# Patient Record
Sex: Male | Born: 1950 | Race: White | Hispanic: No | Marital: Married
Health system: Southern US, Community
[De-identification: ages and names within clinical notes are randomized; demographics above are authoritative.]

---

## 1999-11-17 ENCOUNTER — Encounter: Payer: Self-pay | Admitting: Family Medicine

## 1999-11-17 ENCOUNTER — Encounter: Admission: RE | Admit: 1999-11-17 | Discharge: 1999-11-17 | Payer: Self-pay | Admitting: Family Medicine

## 2002-12-22 ENCOUNTER — Emergency Department (HOSPITAL_COMMUNITY): Admission: EM | Admit: 2002-12-22 | Discharge: 2002-12-22 | Payer: Self-pay

## 2007-02-11 ENCOUNTER — Emergency Department (HOSPITAL_COMMUNITY): Admission: EM | Admit: 2007-02-11 | Discharge: 2007-02-11 | Payer: Self-pay | Admitting: Emergency Medicine

## 2007-02-14 ENCOUNTER — Ambulatory Visit (HOSPITAL_COMMUNITY): Admission: AD | Admit: 2007-02-14 | Discharge: 2007-02-14 | Payer: Self-pay | Admitting: Urology

## 2007-02-24 ENCOUNTER — Ambulatory Visit (HOSPITAL_BASED_OUTPATIENT_CLINIC_OR_DEPARTMENT_OTHER): Admission: RE | Admit: 2007-02-24 | Discharge: 2007-02-24 | Payer: Self-pay | Admitting: Urology

## 2007-03-09 ENCOUNTER — Ambulatory Visit (HOSPITAL_COMMUNITY): Admission: RE | Admit: 2007-03-09 | Discharge: 2007-03-09 | Payer: Self-pay | Admitting: Urology

## 2007-03-09 ENCOUNTER — Encounter: Payer: Self-pay | Admitting: Urology

## 2007-12-21 IMAGING — CR DG ABDOMEN 1V
2 series · 2 of 2 positions shown · non-contrast
Comparison: 02/24/07.

CLINICAL DATA: Pre-op right ureteral stent. 
 ABDOMEN ? 1 VIEW:

[t abdomen supine (1 of 2)]
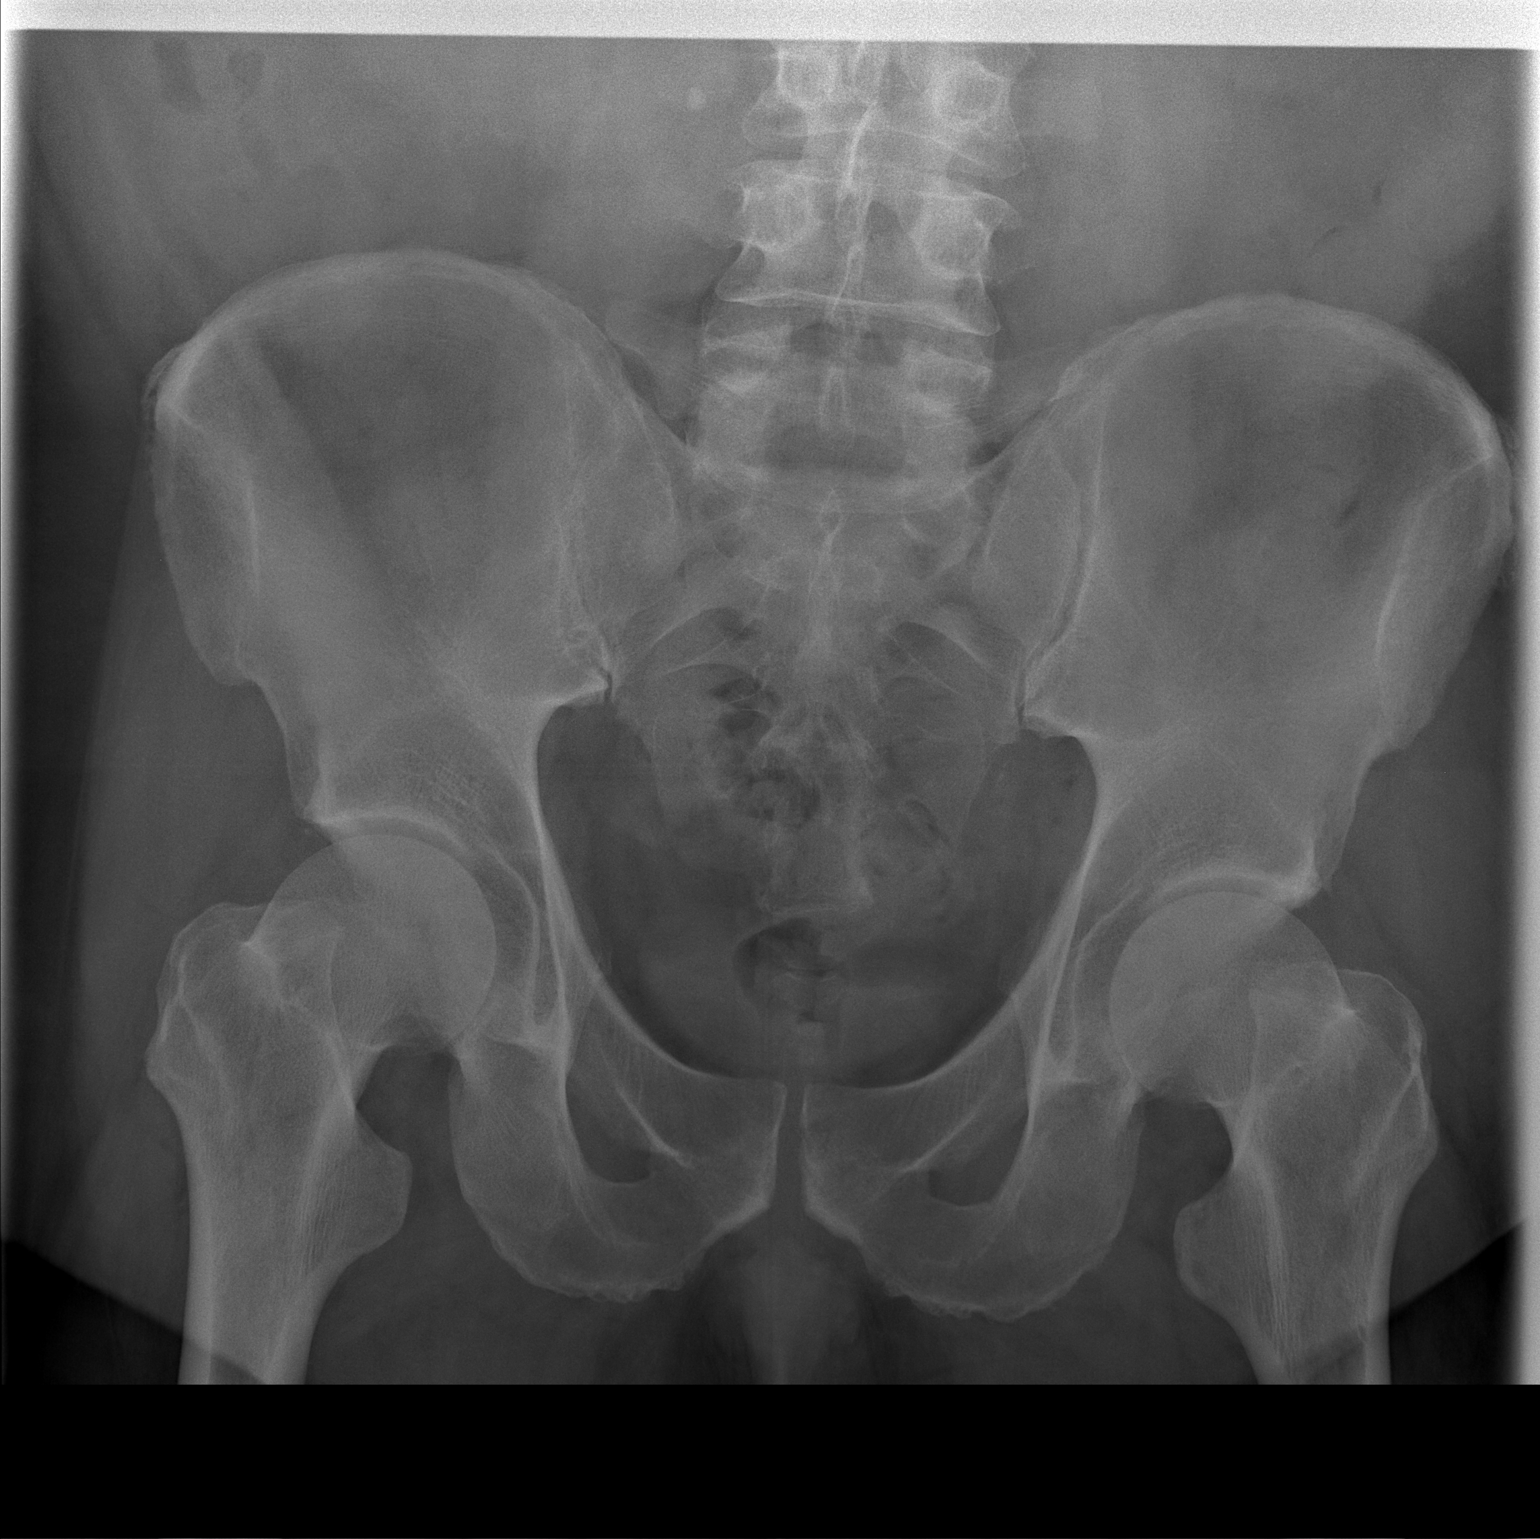

[t abdomen supine (2 of 2)]
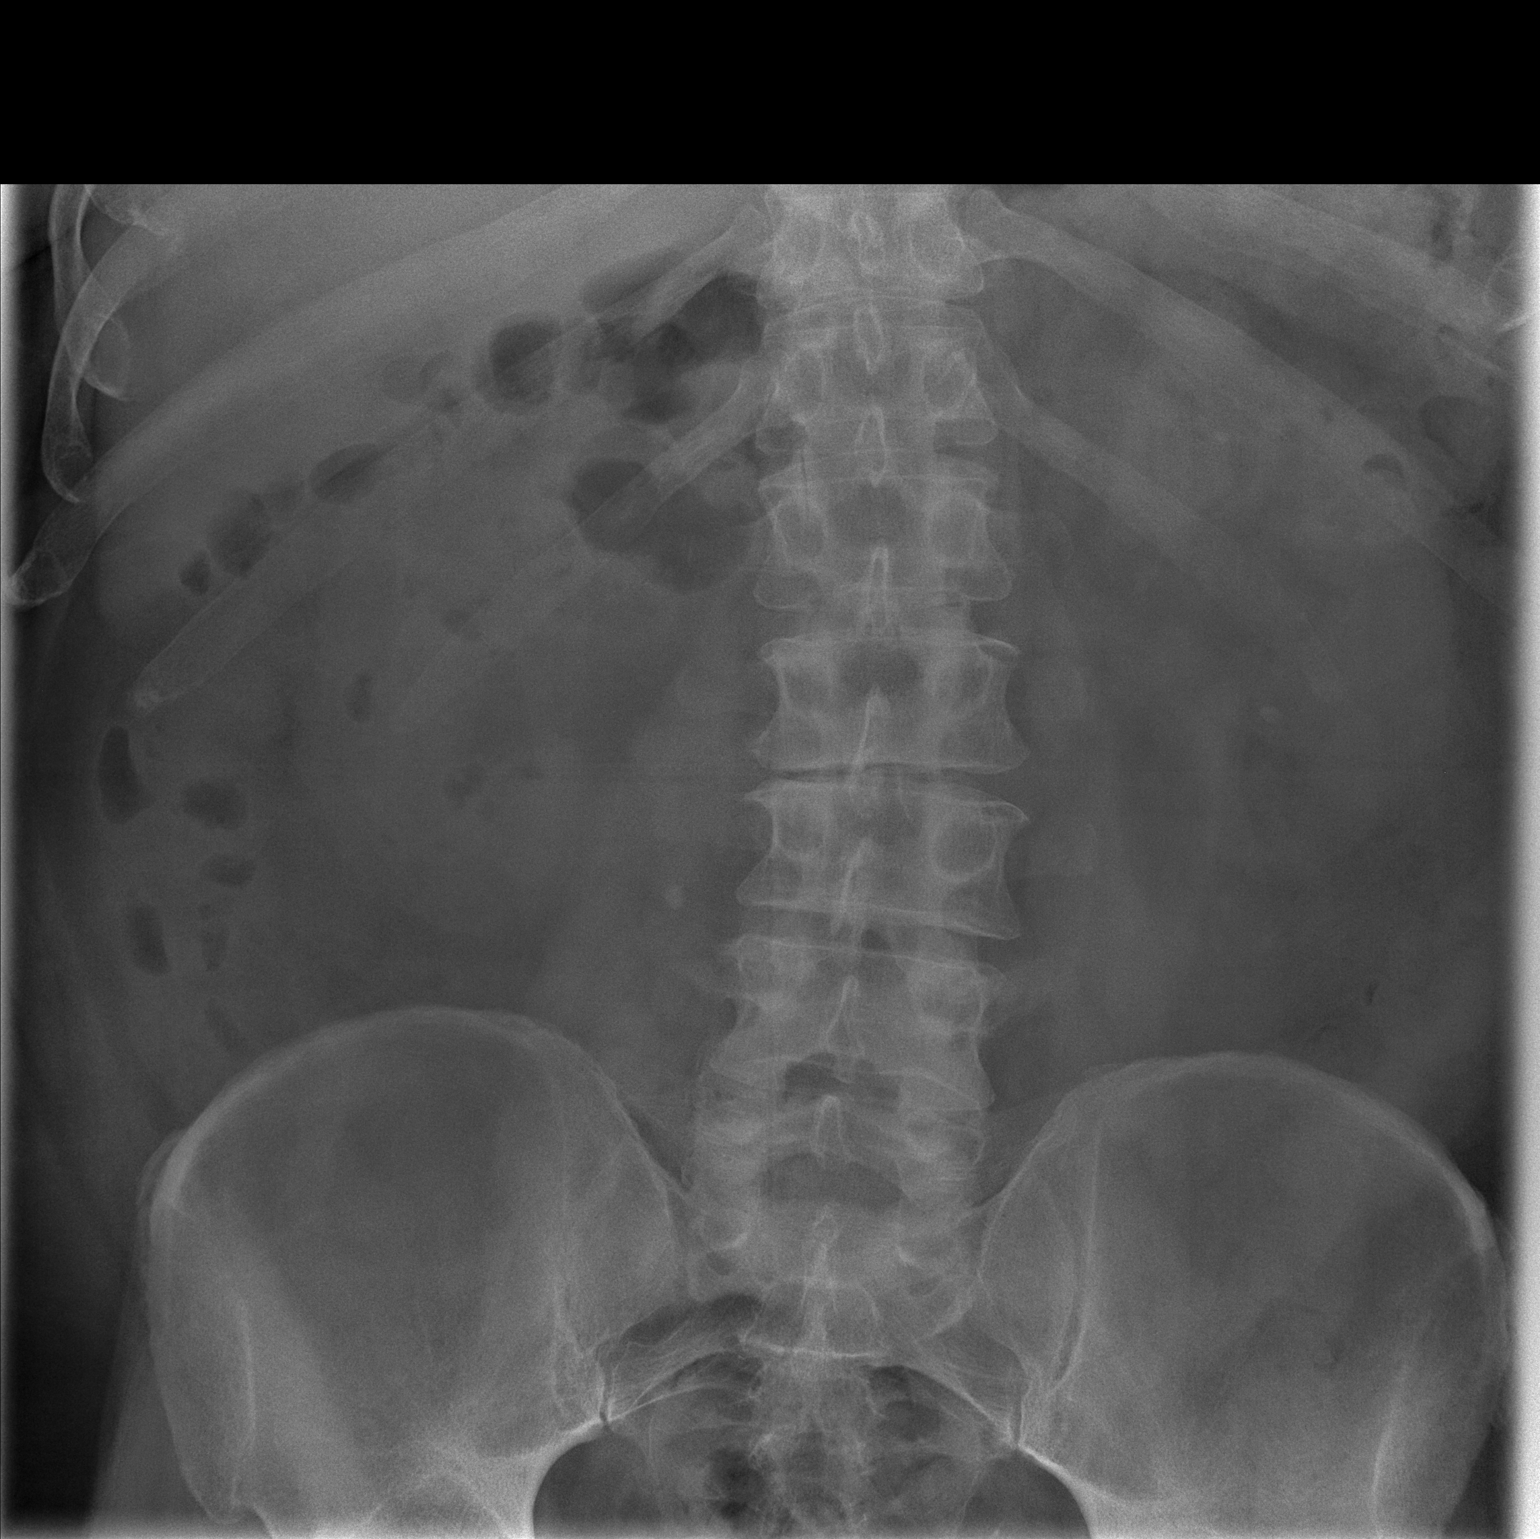

[2 of 2 positions shown; findings below may reference images not displayed]

FINDINGS: 6.2 mm radiopaque structure suspicious for a right ureteral calculus is now located to the right of the L-3 vertebra and previously was at the L2-3 level.  A tiny right renal calculus may have been present previously but is not appreciated on present examination.  At least three and possibly four left renal calculi are suspected largest located along the lower pole region measuring up to 5.8 mm.
IMPRESSION: 6.2 mm right ureteral calculus suspected at the L-3 level.  Please see above.

## 2010-01-09 ENCOUNTER — Encounter: Admission: RE | Admit: 2010-01-09 | Discharge: 2010-01-09 | Payer: Self-pay | Admitting: Orthopedic Surgery

## 2010-01-31 ENCOUNTER — Ambulatory Visit (HOSPITAL_COMMUNITY): Admission: RE | Admit: 2010-01-31 | Discharge: 2010-01-31 | Payer: Self-pay | Admitting: Orthopedic Surgery

## 2010-11-11 IMAGING — CR DG CHEST 2V
2 series · 2 of 2 positions shown · non-contrast
Comparison: PA and lateral chest 02/24/2007.

CLINICAL DATA: Preadmission respiratory film for patient with a
meniscal tear.

CHEST - 2 VIEW

[view not recorded (1 of 2)]
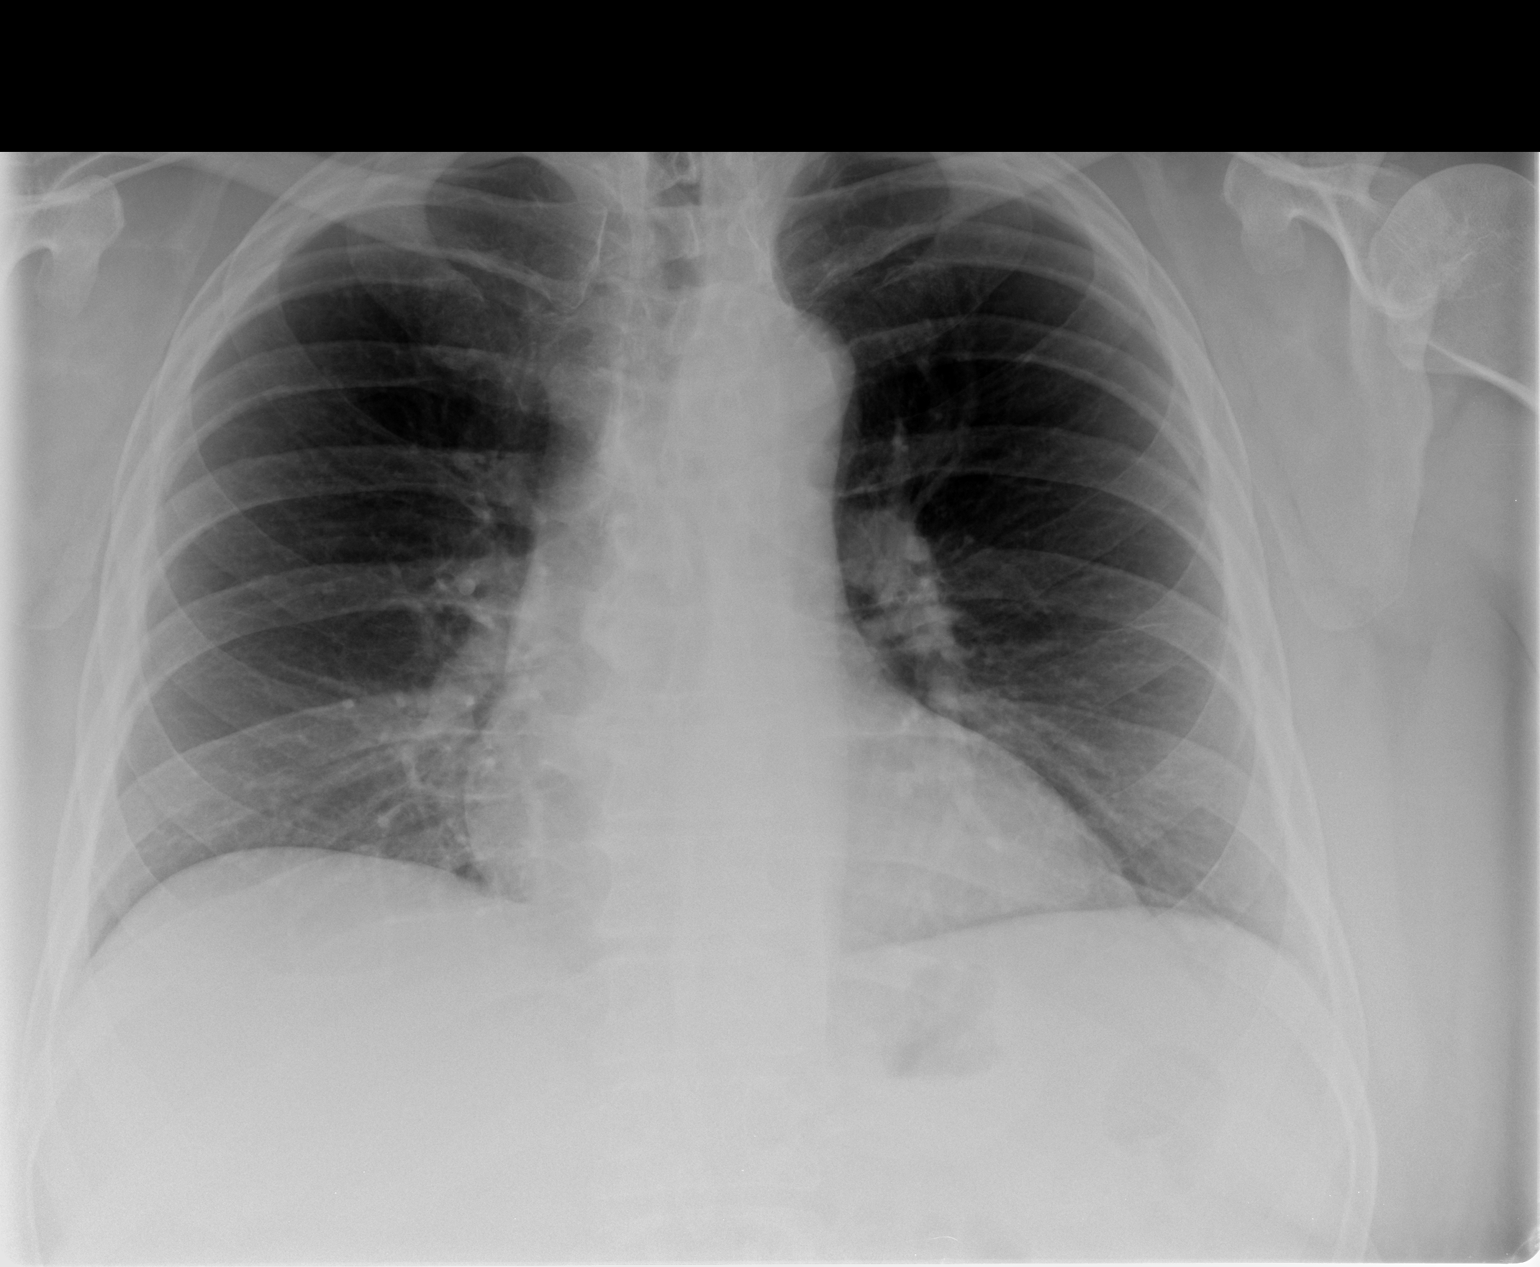

[view not recorded (2 of 2)]
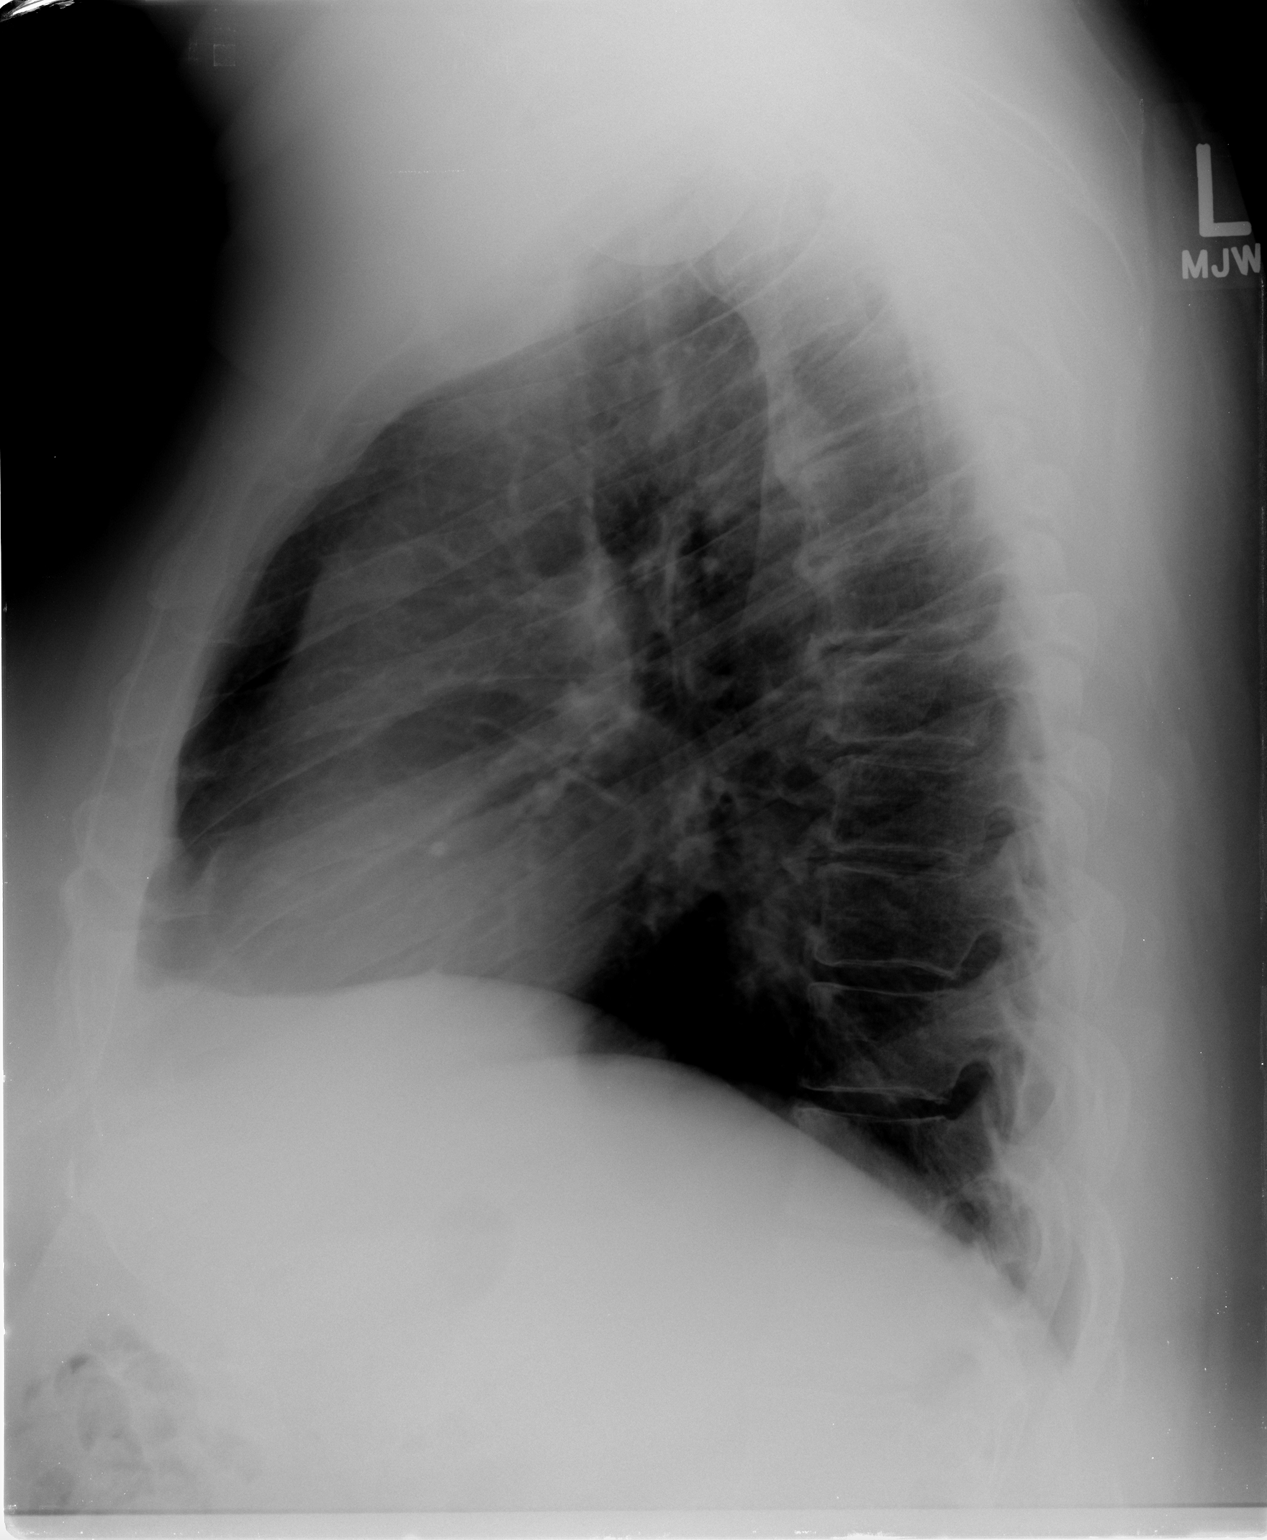

[2 of 2 positions shown; findings below may reference images not displayed]

FINDINGS: Lungs are clear.  Heart size is normal.  No pleural
effusion or focal bony abnormality.
IMPRESSION: No acute disease.

## 2010-12-16 LAB — URINALYSIS, ROUTINE W REFLEX MICROSCOPIC
Bilirubin Urine: NEGATIVE
Glucose, UA: NEGATIVE mg/dL
Hgb urine dipstick: NEGATIVE
Protein, ur: NEGATIVE mg/dL
Specific Gravity, Urine: 1.017 (ref 1.005–1.030)
Urobilinogen, UA: 1 mg/dL (ref 0.0–1.0)

## 2010-12-16 LAB — COMPREHENSIVE METABOLIC PANEL
ALT: 28 U/L (ref 0–53)
AST: 25 U/L (ref 0–37)
Albumin: 4.2 g/dL (ref 3.5–5.2)
Alkaline Phosphatase: 74 U/L (ref 39–117)
CO2: 26 mEq/L (ref 19–32)
Chloride: 101 mEq/L (ref 96–112)
Creatinine, Ser: 0.9 mg/dL (ref 0.4–1.5)
GFR calc non Af Amer: >60 mL/min (ref >60)
Potassium: 4.2 mEq/L (ref 3.5–5.1)
Total Bilirubin: 1.2 mg/dL (ref 0.3–1.2)

## 2010-12-16 LAB — CBC
HCT: 47.2 % (ref 39.0–52.0)
Platelets: 236 10*3/uL (ref 150–400)
WBC: 7.7 10*3/uL (ref 4.0–10.5)

## 2010-12-16 LAB — DIFFERENTIAL
Basophils Absolute: 0.1 10*3/uL (ref 0.0–0.1)
Basophils Relative: 1 % (ref 0–1)
Eosinophils Absolute: 0.3 10*3/uL (ref 0.0–0.7)
Eosinophils Relative: 4 % (ref 0–5)
Monocytes Absolute: 0.6 10*3/uL (ref 0.1–1.0)
Monocytes Relative: 7 % (ref 3–12)
Neutro Abs: 4.7 10*3/uL (ref 1.7–7.7)

## 2010-12-16 LAB — URINE CULTURE: Culture: NO GROWTH

## 2011-02-10 NOTE — Op Note (Signed)
NAMECAMILO, Thomas Garner NO.:  [id number]   MEDICAL RECORD NO.:  [id number]          PATIENT TYPE:  AMB   LOCATION:  [facility]                         FACILITY:  [facility]   PHYSICIAN:  Bertram Millard. Dahlstedt, M.D.DATE OF BIRTH:  06-19-1951   DATE OF PROCEDURE:  02/24/2007  DATE OF DISCHARGE:                               OPERATIVE REPORT   PREOPERATIVE DIAGNOSIS:  Right ureteral stone, status post lithotripsy  with inadequate fragmentation.   POSTOPERATIVE DIAGNOSES:  1. Right ureteral/renal stone.  2. Left renal pelvic stone.   SURGICAL PROCEDURES:  1. Cystoscopy.  2. Bilateral retrograde ureteropyelograms.  3. Bilateral ureteroscopy with holmium laser lithotripsy renal      calculi.  4. Bilateral double-J stent placement.   SURGEON:  Bertram Millard. Dahlstedt, M.D.   ANESTHESIA:  General endotracheal.   COMPLICATIONS:  None.   BRIEF HISTORY:  Mr. Thomas Garner is a 60 year old male who presented last week  to Dr. Annabell Howells with significant right flank pain.  He had a 6 mm right UPJ  stone as well as a 5 mm left renal pelvic stone.  His pain was on the  right.  He underwent lithotripsy urgently.  The patient did well during  lithotripsy but failed pass any fragments.  He was followed yesterday in  the office.  He was having significant flank pain.  He had a hard time  categorizing it.  KUB revealed a left renal pelvic stone and a right  proximal ureteral stone.  As he is significantly symptomatic and had no  significant fragmentation of his stone, it was recommended that he  undergo ureteroscopy on the right with holmium laser lithotripsy of the  stone.  The patient presents at this time for the procedure, having been  instructed on the risks and complications.   DESCRIPTION OF PROCEDURE:  The patient was administered preoperative IV  antibiotics and identified in the holding area.  He was brought to the  operating room, where a general anesthetic was administered using the  endotracheal tube.  He was placed in the dorsal lithotomy position.  Genitalia and perineum were prepped and draped.  A 22-French  panendoscope was advanced through his urethra.  The bladder was  inspected and found be normal.  The right ureteral orifice was  cannulated with a #6 end-hole catheter.  A retrograde was performed.  Just gentle pressure knocked the stone into the right renal pelvis.  At  this I point realized that I was not going to be able to do a rigid  ureteroscopy.  The flexible ureteroscope was passed after the ureter was  dilated using a ureteral access sheath (inner sheath).  The entire renal  pelvis was inspected.  I never saw the large 5 mm stone.  There was a  smaller stone in an upper pole calix that I fragmented into several  smaller pieces.  I spent approximately 45 minutes inspecting the  remaining right renal pyelocaliceal system.  The super soft flexible  ureteroscope was also brought in so I could get better inspection of the  lower pole calyces.  There was only  that one stone that I saw, which was  fragmented to a couple of smaller fragments.   On the preoperative KUB there was a stone in the left renal pelvis,  which I thought looked foreboding.  As he was having a hard time  telling me which side he was hurting on yesterday, I though it would be  best to possibly take care of that left renal stone as well.  I  performed a retrograde and this showed a filling defect right at the  left UPJ.  I then advanced the super soft ureteroscope over top of the  guidewire after the ureter had been dilated the same as the right side.  I encountered the mulberry stone in the right renal pelvis and  fragmented this up into approximately 6 or 7 fragments using the holmium  laser.  I did not extract any fragments.  I then placed double-J stents  bilaterally, 6-French x 26 cm.  Good curls were seen proximally and  distally using fluoroscopic and cystoscopic guidance.  At this  point the  bladder was drained and the procedure terminated.   The patient tolerated the procedure well.  He was awakened and taken to  the PACU in stable condition.   He has Percocet at home.  He will also be sent home on 3 days of Cipro  500 mg one p.o. b.i.d. and Ditropan 5 mg generic one p.o. q.8h. p.r.n.  stent pain, #30.  He will follow up in 1 week.      Bertram Millard. Dahlstedt, M.D.  Electronically Signed     SMD/MEDQ  D:  02/24/2007  T:  02/24/2007  Job:  [id number]   cc:   [facility]

## 2011-02-10 NOTE — Op Note (Signed)
NAMEKEVIS, QU NO.:  [id number]   MEDICAL RECORD NO.:  [id number]          PATIENT TYPE:  AMB   LOCATION:  [facility]                          FACILITY:  [facility]   PHYSICIAN:  Excell Seltzer. Annabell Howells, M.D.    DATE OF BIRTH:  08/04/1951   DATE OF PROCEDURE:  03/09/2007  DATE OF DISCHARGE:                               OPERATIVE REPORT   PROCEDURE:  Right ureteroscopic stone extraction with holmium  lasertripsy and the insertion of right double-J stent.   PREOPERATIVE DIAGNOSIS:  Right proximal ureteral stone.   POSTOPERATIVE DIAGNOSIS:  Right proximal ureteral stone.   SURGEON:  Bjorn Pippin, MD   ANESTHESIA:  General.   SPECIMEN:  None.   DRAINS:  A 6-French x 26-cm double-J stent.   COMPLICATIONS:  None.   INDICATIONS:  Mr Delamater is a 60 year old white male with a history of a  right proximal ureter stone.  He also had a left renal pelvic stone.  He  initially was treated for the right stone with lithotripsy with no  fragmentation.  He then underwent bilateral ureteroscopy with successful  lasertripsy of his left renal pelvic stone, but the right ureteral stone  retropulsed to the kidney and could not be identified, despite vigorous  efforts.  He has had recurrent pain and the stone has returned to its  ureteral position.  He is to undergo repeat ureteroscopy today.   FINDINGS AND PROCEDURE:  The patient was given IV gentamicin and had  been on antibiotics preoperatively.  A general anesthetic was induced.  He was placed in lithotomy position.  He was fitted with PAS hose.  His  perineum and genitalia were prepped with Betadine solution.  He was  draped in usual sterile fashion.  Cystoscopy was performed using a 22-  [place] scope and the 12-degree lens.  Examination revealed a normal  urethra.  The external sphincter was intact.  The prostatic urethra was  short without obstruction.  Examination of the bladder revealed mild  trabeculation.  The left ureteral  orifice was unremarkable.  There was  some edema of the right ureteral orifice, but it seemed widely patent.   The cystoscope was removed and a 6-French short ureteroscope was passed  without difficulty through the urethra, up the right ureteral orifice.  I was able to get to the level of the vessels, but could not advance  beyond that.  A wire was then passed through the ureteroscope and I was  able to advance it beyond the vessels, but was not able to reach the  level of the stone, which was easily seen on fluoroscopy.  The guidewire  was then passed to the kidney and left in place.  The 6-French scope was  removed and a 12/14 thirty-five-centimeter dilator was passed to its  maximum extent and secured.  The wire and dilator core were removed and  a 6-French flexible ureteroscope was passed.  I realized at this point I  needed a longer sheath, so the 35-cm sheath was exchanged for a 55-cm  sheath, which was able to reach to the renal  pelvis.  The flexible  ureteroscope was then reinserted.  The stone was visualized and was  engaged with the holmium laser using the 200-micron fiber.  The stone  was readily fragmented into small fragments that appeared to measure in  the range of 1-3 mm.  I was unable to retrieve any of fragments with a  nitinol basket due to their small size, despite an attempt.  The kidney  was flushed and a small fragment was noted to drain from the kidney.  Final fluoroscopy of the kidney revealed no obvious large stone  fragments.  The guidewire was then was reinserted through the access  sheath.  The access sheath was removed.  The cystoscope was reinserted  over the wire and a 6-French 26-cm double-J stent with string was  inserted to the kidney under fluoroscopic guidance.  The wire was  removed, leaving a good coil in the kidney and a good coil in the  bladder.  The bladder was drained.  The cystoscope was removed, leaving  the stent string exiting the urethra.   The position of the stent was  confirmed; it was noted to be excellent.  The stent string was secured  to the patient's penis.  He was taken down from lithotomy position.  His  anesthetic was reversed.  was moved to the recovery room in stable  condition.  There were no complications.      Excell Seltzer. Annabell Howells, M.D.  Electronically Signed     JJW/MEDQ  D:  03/09/2007  T:  03/10/2007  Job:  [id number]

## 2014-10-05 ENCOUNTER — Encounter: Payer: Self-pay | Admitting: Gastroenterology

## 2017-01-18 ENCOUNTER — Telehealth: Payer: Self-pay | Admitting: *Deleted

## 2017-01-18 NOTE — Telephone Encounter (Signed)
Patient calling this office wanting to make a self referral. He states he was just diagnosed with Multiple Myeloma on March 28th but his PCP won't make a referral to an oncologist.  This office was able to pull up records from his March 28th visit with his PCP at [facility]. The patient was not diagnosed with MM but rather had a slightly high protein level. His physician wants him to repeat labs in 3 months to monitor. A possible differential included MM.   Dr Marin Olp reviewed encounter and lab work and didn't see any cause for concern or anything that would indicate the patient had Multiple Myeloma. He wants to defer on the referral at this time.   Office referral manager will notify the patient.
